# Patient Record
Sex: Male | Born: 2000 | Race: White | Hispanic: No | Marital: Single | Smoking: Never smoker
Health system: Southern US, Community
[De-identification: ages and names within clinical notes are randomized; demographics above are authoritative.]

---

## 2020-09-26 ENCOUNTER — Other Ambulatory Visit: Payer: Self-pay

## 2020-09-26 ENCOUNTER — Emergency Department (HOSPITAL_BASED_OUTPATIENT_CLINIC_OR_DEPARTMENT_OTHER)
Admission: EM | Admit: 2020-09-26 | Discharge: 2020-09-27 | Disposition: A | Discharge status: Home / Self Care | Payer: BC Managed Care – PPO | Attending: Emergency Medicine | Admitting: Emergency Medicine

## 2020-09-26 ENCOUNTER — Encounter (HOSPITAL_BASED_OUTPATIENT_CLINIC_OR_DEPARTMENT_OTHER): Payer: Self-pay | Admitting: *Deleted

## 2020-09-26 DIAGNOSIS — R000 Tachycardia, unspecified: Secondary | ICD-10-CM | POA: Insufficient documentation

## 2020-09-26 DIAGNOSIS — R0602 Shortness of breath: Secondary | ICD-10-CM | POA: Diagnosis present

## 2020-09-26 DIAGNOSIS — U071 COVID-19: Secondary | ICD-10-CM | POA: Diagnosis not present

## 2020-09-26 DIAGNOSIS — E876 Hypokalemia: Secondary | ICD-10-CM | POA: Insufficient documentation

## 2020-09-26 MED ORDER — ACETAMINOPHEN 325 MG PO TABS
650.0000 mg | ORAL_TABLET | Freq: Once | ORAL | Status: AC
  Administered 2020-09-26: 650 mg via ORAL
  Filled 2020-09-26: qty 2

## 2020-09-26 NOTE — ED Triage Notes (Signed)
His at home Covid test was positive today. Here with Covid symptoms. He took Ibuprofen 4pm. Fever noted.

## 2020-09-27 ENCOUNTER — Emergency Department (HOSPITAL_BASED_OUTPATIENT_CLINIC_OR_DEPARTMENT_OTHER): Payer: BC Managed Care – PPO

## 2020-09-27 LAB — COMPREHENSIVE METABOLIC PANEL
ALT: 34 U/L (ref 0–44)
AST: 27 U/L (ref 15–41)
Albumin: 4.5 g/dL (ref 3.5–5.0)
Alkaline Phosphatase: 57 U/L (ref 38–126)
Anion gap: 11 (ref 5–15)
BUN: 9 mg/dL (ref 6–20)
CO2: 23 mmol/L (ref 22–32)
Calcium: 8.9 mg/dL (ref 8.9–10.3)
Chloride: 102 mmol/L (ref 98–111)
Creatinine, Ser: 0.93 mg/dL (ref 0.61–1.24)
GFR, Estimated: >60 mL/min (ref >60)
Glucose, Bld: 109 mg/dL — ABNORMAL HIGH (ref 70–99)
Potassium: 2.9 mmol/L — ABNORMAL LOW (ref 3.5–5.1)
Sodium: 136 mmol/L (ref 135–145)
Total Bilirubin: 0.8 mg/dL (ref 0.3–1.2)
Total Protein: 7.8 g/dL (ref 6.5–8.1)

## 2020-09-27 LAB — CBC WITH DIFFERENTIAL/PLATELET
Abs Immature Granulocytes: 0.03 10*3/uL (ref 0.00–0.07)
Basophils Absolute: 0.1 10*3/uL (ref 0.0–0.1)
Basophils Relative: 1 %
Eosinophils Absolute: 0 10*3/uL (ref 0.0–0.5)
Eosinophils Relative: 0 %
HCT: 39.8 % (ref 39.0–52.0)
Hemoglobin: 14 g/dL (ref 13.0–17.0)
Immature Granulocytes: 0 %
Lymphocytes Relative: 8 %
Lymphs Abs: 0.6 10*3/uL — ABNORMAL LOW (ref 0.7–4.0)
MCH: 30.2 pg (ref 26.0–34.0)
MCHC: 35.2 g/dL (ref 30.0–36.0)
MCV: 85.8 fL (ref 80.0–100.0)
Monocytes Absolute: 1.3 10*3/uL — ABNORMAL HIGH (ref 0.1–1.0)
Monocytes Relative: 16 %
Neutro Abs: 6 10*3/uL (ref 1.7–7.7)
Neutrophils Relative %: 75 %
Platelets: 256 10*3/uL (ref 150–400)
RBC: 4.64 MIL/uL (ref 4.22–5.81)
RDW: 12.2 % (ref 11.5–15.5)
WBC: 8 10*3/uL (ref 4.0–10.5)
nRBC: 0 % (ref 0.0–0.2)

## 2020-09-27 LAB — RESP PANEL BY RT-PCR (FLU A&B, COVID) ARPGX2
Influenza A by PCR: NEGATIVE
Influenza B by PCR: NEGATIVE
SARS Coronavirus 2 by RT PCR: POSITIVE — AB

## 2020-09-27 MED ORDER — POTASSIUM CHLORIDE CRYS ER 20 MEQ PO TBCR
40.0000 meq | EXTENDED_RELEASE_TABLET | Freq: Once | ORAL | Status: AC
  Administered 2020-09-27: 40 meq via ORAL
  Filled 2020-09-27: qty 2

## 2020-09-27 MED ORDER — POTASSIUM CHLORIDE CRYS ER 20 MEQ PO TBCR: 20.0000 meq | EXTENDED_RELEASE_TABLET | Freq: Two times a day (BID) | ORAL | 0 refills | Status: AC

## 2020-09-27 MED ORDER — ONDANSETRON HCL 4 MG/2ML IJ SOLN
4.0000 mg | Freq: Once | INTRAMUSCULAR | Status: AC
  Administered 2020-09-27: 4 mg via INTRAVENOUS
  Filled 2020-09-27: qty 2

## 2020-09-27 MED ORDER — SODIUM CHLORIDE 0.9 % IV BOLUS
1000.0000 mL | Freq: Once | INTRAVENOUS | Status: AC
  Administered 2020-09-27: 1000 mL via INTRAVENOUS

## 2020-09-27 MED ORDER — KETOROLAC TROMETHAMINE 30 MG/ML IJ SOLN
30.0000 mg | Freq: Once | INTRAMUSCULAR | Status: AC
  Administered 2020-09-27: 30 mg via INTRAVENOUS
  Filled 2020-09-27: qty 1

## 2020-09-27 MED ORDER — PAXLOVID 10 X 150 MG & 10 X 100MG PO TBPK: 2.0000 | ORAL_TABLET | Freq: Two times a day (BID) | ORAL | 0 refills | Status: AC

## 2020-09-27 NOTE — Discharge Instructions (Addendum)
Keep your self quarantined as we discussed.  Use Tylenol or Motrin as needed for aches and fever.  Follow-up with your doctor.  Return to the ED with difficulty breathing, not able to eat or drink, confusion, any other concerns.

## 2020-09-27 NOTE — ED Provider Notes (Signed)
[facility] EMERGENCY DEPARTMENT Provider Note   CSN: [id number] Arrival date & time: 09/26/20  2241     History No chief complaint on file.   Clifford Duncan is a 20 y.o. male.  Patient here today after testing positive for COVID at home.  States he developed chills last night and then today he has had congestion, body aches, shortness of breath, nausea, vomiting, sore throat.  Febrile on arrival but did not check his temperature at home.  Last took ibuprofen at 4 PM.  States he came in because he felt short of breath after vomiting.  Denies any diarrhea.  Does feel some shortness of breath currently but no chest pain.  Has a headache and body aches and chills. No recent travel or sick contacts.  No regular medications.       History reviewed. No pertinent past medical history.  There are no problems to display for this patient.   History reviewed. No pertinent surgical history.     No family history on file.  Social History   Tobacco Use   Smoking status: Never   Smokeless tobacco: Never  Vaping Use   Vaping Use: Never used  Substance Use Topics   Alcohol use: Not Currently    Home Medications Prior to Admission medications   Not on File    Allergies    Patient has no known allergies.  Review of Systems   Review of Systems  Constitutional:  Positive for activity change, appetite change, fatigue and fever.  HENT:  Positive for congestion, rhinorrhea and sore throat.   Respiratory:  Positive for cough.   Cardiovascular:  Negative for chest pain.  Gastrointestinal:  Positive for nausea and vomiting. Negative for abdominal pain.  Genitourinary:  Negative for dysuria and hematuria.  Musculoskeletal:  Positive for arthralgias and myalgias.  Skin:  Negative for rash.  Neurological:  Positive for weakness and headaches. Negative for dizziness.   all other systems are negative except as noted in the HPI and PMH.   Physical Exam Updated Vital Signs BP (!)  158/95 (BP Location: Right Arm)   Pulse (!) 133   Temp (!) 103.1 F (39.5 C) (Oral)   Resp 18   Ht 5\' 7"  (1.702 m)   Wt 113.4 kg   SpO2 98%   BMI 39.16 kg/m   Physical Exam Vitals and nursing note reviewed.  Constitutional:      General: He is not in acute distress.    Appearance: He is well-developed. He is obese. He is ill-appearing.  HENT:     Head: Normocephalic and atraumatic.     Mouth/Throat:     Pharynx: No oropharyngeal exudate.  Eyes:     Conjunctiva/sclera: Conjunctivae normal.     Pupils: Pupils are equal, round, and reactive to light.  Neck:     Comments: No meningismus. Cardiovascular:     Rate and Rhythm: Regular rhythm. Tachycardia present.     Heart sounds: Normal heart sounds. No murmur heard. Pulmonary:     Effort: Pulmonary effort is normal. No respiratory distress.     Breath sounds: Normal breath sounds. No wheezing.  Abdominal:     Palpations: Abdomen is soft.     Tenderness: There is no abdominal tenderness. There is no guarding or rebound.  Musculoskeletal:        General: No tenderness. Normal range of motion.     Cervical back: Normal range of motion and neck supple.  Skin:    General: Skin  is warm.  Neurological:     Mental Status: He is alert and oriented to person, place, and time.     Cranial Nerves: No cranial nerve deficit.     Motor: No abnormal muscle tone.     Coordination: Coordination normal.     Comments:  5/5 strength throughout. CN 2-12 intact.Equal grip strength.   Psychiatric:        Behavior: Behavior normal.    ED Results / Procedures / Treatments   Labs (all labs ordered are listed, but only abnormal results are displayed) Labs Reviewed  RESP PANEL BY RT-PCR (FLU A&B, COVID) ARPGX2 - Abnormal; Notable for the following components:      Result Value   SARS Coronavirus 2 by RT PCR POSITIVE (*)    All other components within normal limits  CBC WITH DIFFERENTIAL/PLATELET - Abnormal; Notable for the following  components:   Lymphs Abs 0.6 (*)    Monocytes Absolute 1.3 (*)    All other components within normal limits  COMPREHENSIVE METABOLIC PANEL - Abnormal; Notable for the following components:   Potassium 2.9 (*)    Glucose, Bld 109 (*)    All other components within normal limits    EKG None  Radiology DG Chest Portable 1 View  Result Date: 09/27/2020 CLINICAL DATA:  Cough shortness of breath, positive COVID home testing EXAM: PORTABLE CHEST 1 VIEW COMPARISON:  None. FINDINGS: Accounting for body habitus, the lungs are clear. No consolidation, features of edema, pneumothorax, or effusion. Pulmonary vascularity is normally distributed. The cardiomediastinal contours are unremarkable. No acute osseous or soft tissue abnormality. IMPRESSION: No acute cardiopulmonary abnormality. Electronically Signed   By: Kreg Shropshire M.D.   On: 09/27/2020 00:43    Procedures Procedures   Medications Ordered in ED Medications  sodium chloride 0.9 % bolus 1,000 mL (has no administration in time range)  ondansetron (ZOFRAN) injection 4 mg (has no administration in time range)  ketorolac (TORADOL) 30 MG/ML injection 30 mg (has no administration in time range)  acetaminophen (TYLENOL) tablet 650 mg (650 mg Oral Given 09/26/20 2259)    ED Course  I have reviewed the triage vital signs and the nursing notes.  Pertinent labs & imaging results that were available during my care of the patient were reviewed by me and considered in my medical decision making (see chart for details).    MDM Rules/Calculators/A&P                          Patient from home with COVID symptoms including cough, congestion, fever, chills, nausea, vomiting, shortness of breath.  He is tachycardic and febrile on arrival.  He is given IV fluids.  Chest x-ray is negative.  Labs show mild hypokalemia which is replaced.  Patient feels much better after improvement of his fever and heart rate.  He is tolerating p.o., ambulatory without  hypoxia.  No increased work of breathing.  He is tolerating p.o. and is ambulatory without desaturation.  Discussed p.o. hydration at home, quarantine precautions, antipyretics. Given his history of asthmatic bronchitis will initiate Paxlovid.  Patient agreeable. Return precautions discussed.    Clifford Duncan was evaluated in Emergency Department on 09/27/2020 for the symptoms described in the history of present illness. He was evaluated in the context of the global COVID-19 pandemic, which necessitated consideration that the patient might be at risk for infection with the SARS-CoV-2 virus that causes COVID-19. Institutional protocols and algorithms that pertain to the  evaluation of patients at risk for COVID-19 are in a state of rapid change based on information released by regulatory bodies including the CDC and federal and state organizations. These policies and algorithms were followed during the patient's care in the ED.  Final Clinical Impression(s) / ED Diagnoses Final diagnoses:  COVID-19  Hypokalemia    Rx / DC Orders ED Discharge Orders     None        Maila Dukes, Jeannett Senior, MD 09/27/20 [phone]

## 2023-01-02 IMAGING — DX DG CHEST 1V PORT
1 series · 1 of 1 positions shown · non-contrast
Comparison: None.

CLINICAL DATA: Cough shortness of breath, positive COVID home
testing

EXAM:
PORTABLE CHEST 1 VIEW

[chest ap]
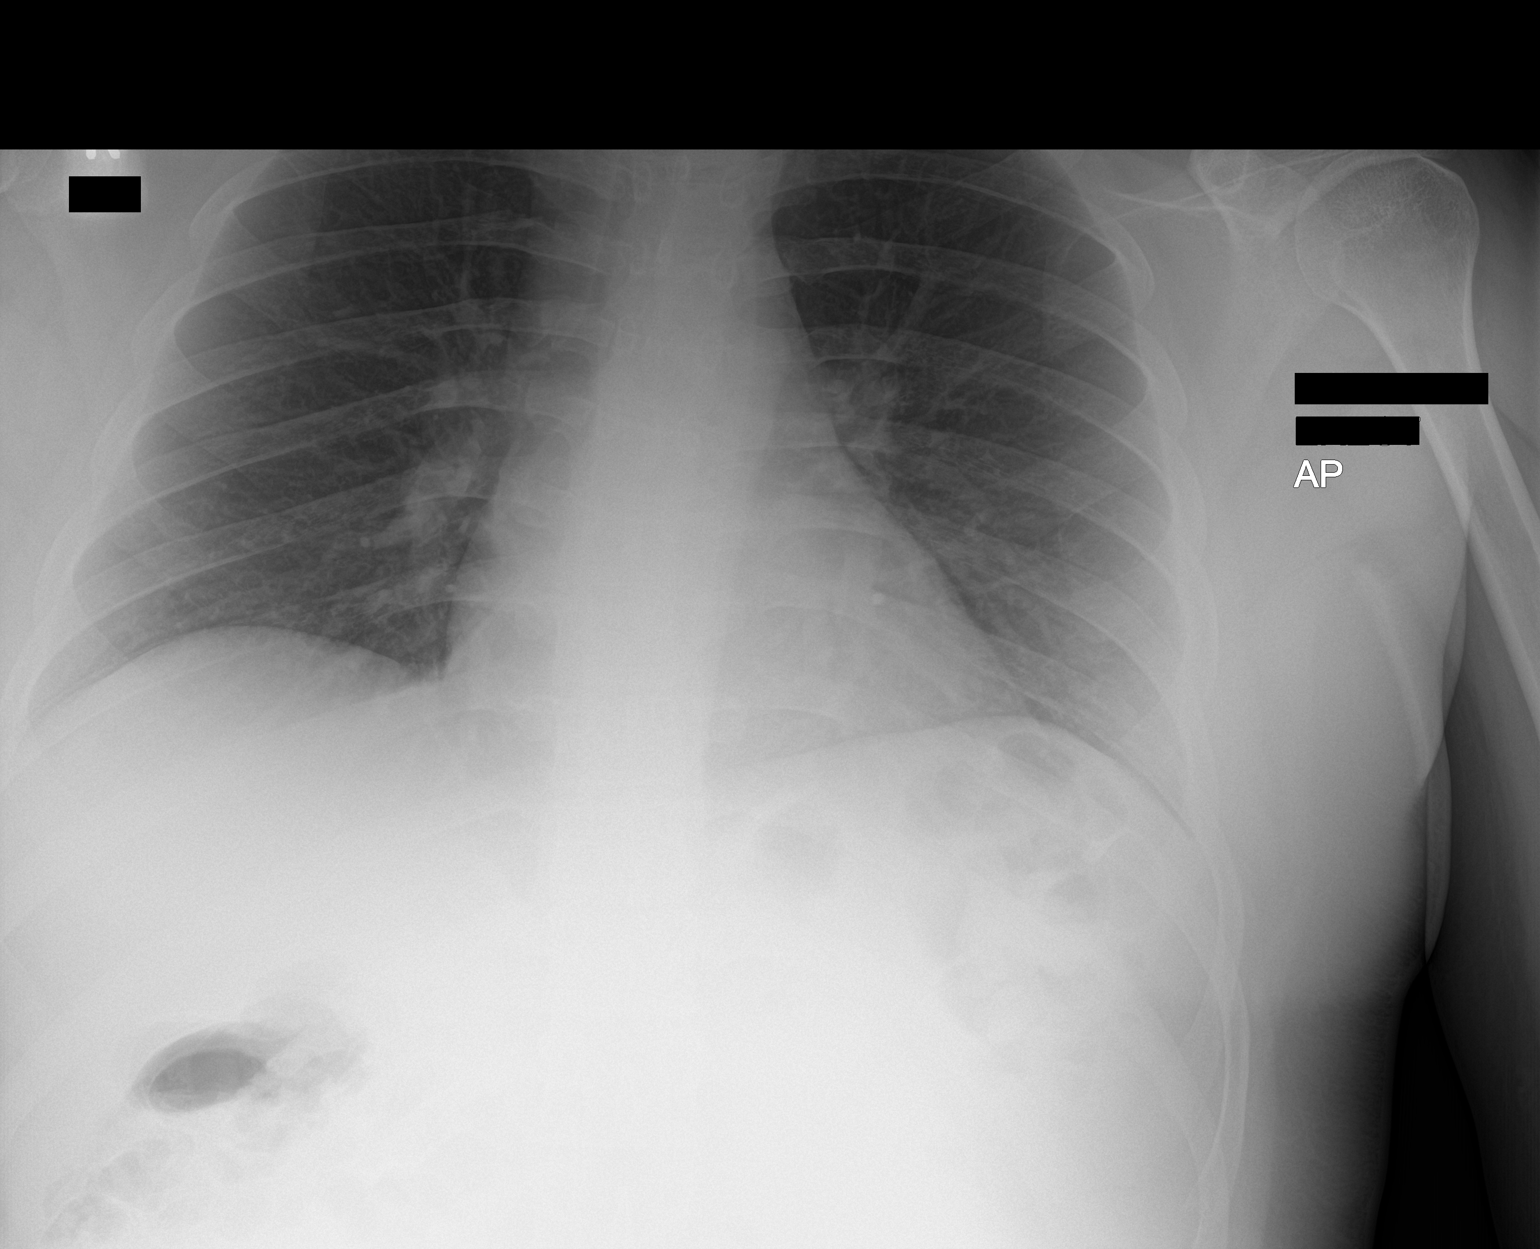

[1 of 1 positions shown; findings below may reference images not displayed]

FINDINGS: Accounting for body habitus, the lungs are clear. No consolidation,
features of edema, pneumothorax, or effusion. Pulmonary vascularity
is normally distributed. The cardiomediastinal contours are
unremarkable. No acute osseous or soft tissue abnormality.
IMPRESSION: No acute cardiopulmonary abnormality.
